# Patient Record
Sex: Male | Born: 1980
Health system: Western US, Academic
[De-identification: ages and names within clinical notes are randomized; demographics above are authoritative.]

## PROBLEM LIST (undated history)

## (undated) DIAGNOSIS — T7840XA Allergy, unspecified, initial encounter: Secondary | ICD-10-CM

## (undated) DIAGNOSIS — K219 Gastro-esophageal reflux disease without esophagitis: Secondary | ICD-10-CM

## (undated) DIAGNOSIS — IMO0002 Reserved for concepts with insufficient information to code with codable children: Secondary | ICD-10-CM

## (undated) HISTORY — DX: Reserved for concepts with insufficient information to code with codable children: IMO0002

## (undated) HISTORY — DX: Allergy, unspecified, initial encounter: T78.40XA

## (undated) HISTORY — DX: Gastro-esophageal reflux disease without esophagitis: K21.9

---

## 2005-03-04 ENCOUNTER — Emergency Department (HOSPITAL_COMMUNITY): Admission: EM | Admit: 2005-03-04 | Discharge: 2005-03-04 | Payer: Self-pay | Admitting: Family Medicine

## 2005-03-15 ENCOUNTER — Emergency Department (HOSPITAL_COMMUNITY): Admission: EM | Admit: 2005-03-15 | Discharge: 2005-03-15 | Payer: Self-pay | Admitting: Family Medicine

## 2006-07-09 ENCOUNTER — Emergency Department (HOSPITAL_COMMUNITY): Admission: EM | Admit: 2006-07-09 | Discharge: 2006-07-09 | Payer: Self-pay | Admitting: Emergency Medicine

## 2006-07-10 ENCOUNTER — Emergency Department (HOSPITAL_COMMUNITY): Admission: EM | Admit: 2006-07-10 | Discharge: 2006-07-10 | Payer: Self-pay | Admitting: Emergency Medicine

## 2006-07-11 ENCOUNTER — Emergency Department (HOSPITAL_COMMUNITY): Admission: EM | Admit: 2006-07-11 | Discharge: 2006-07-11 | Payer: Self-pay | Admitting: Emergency Medicine

## 2006-07-15 ENCOUNTER — Emergency Department (HOSPITAL_COMMUNITY): Admission: EM | Admit: 2006-07-15 | Discharge: 2006-07-15 | Payer: Self-pay | Admitting: Emergency Medicine

## 2007-04-23 ENCOUNTER — Emergency Department (HOSPITAL_COMMUNITY): Admission: EM | Admit: 2007-04-23 | Discharge: 2007-04-23 | Payer: Self-pay | Admitting: Family Medicine

## 2010-03-03 ENCOUNTER — Encounter
Admission: RE | Admit: 2010-03-03 | Discharge: 2010-03-03 | Payer: Self-pay | Source: Home / Self Care | Attending: Family Medicine | Admitting: Family Medicine

## 2010-03-05 ENCOUNTER — Encounter: Payer: Self-pay | Admitting: Gastroenterology

## 2010-03-26 NOTE — Letter (Signed)
Summary: New Patient letter  [facility]  [address] [place], [place] [address]   Phone: [phone]  Fax: [phone]       03/05/2010 MRN: [id number]  Luis Mcdonald [address] [place], [place]  [address]  Dear Luis Mcdonald,  Welcome to the Gastroenterology Division at [facility].    You are scheduled to see Dr.  Jarold Motto on   03-26-2010 at 8:30am on the 3rd floor at [facility], [address]  We ask that you try to arrive at our office 15 minutes prior to your appointment time to allow for check-in.  We would like you to complete the enclosed self-administered evaluation form prior to your visit and bring it with you on the day of your appointment.  We will review it with you.  Also, please bring a complete list of all your medications or, if you prefer, bring the medication bottles and we will list them.  Please bring your insurance card so that we may make a copy of it.  If your insurance requires a referral to see a specialist, please bring your referral form from your primary care physician.  Co-payments are due at the time of your visit and may be paid by cash, check or credit card.     Your office visit will consist of a consult with your physician (includes a physical exam), any laboratory testing he/she may order, scheduling of any necessary diagnostic testing (e.g. x-ray, ultrasound, CT-scan), and scheduling of a procedure (e.g. Endoscopy, Colonoscopy) if required.  Please allow enough time on your schedule to allow for any/all of these possibilities.    If you cannot keep your appointment, please call [phone] to cancel or reschedule prior to your appointment date.  This allows [place] the opportunity to schedule an appointment for another patient in need of care.  If you do not cancel or reschedule by 5 p.m. the business day prior to your appointment date, you will be charged a $50.00 late cancellation/no-show fee.    Thank you for choosing  [facility] for your medical needs.  We appreciate the opportunity to care for you.  Please visit [place] at our website  to learn more about our practice.                     Sincerely,                                                             The Gastroenterology Division

## 2011-02-23 HISTORY — PX: UPPER GI ENDOSCOPY: SHX6162

## 2011-10-11 ENCOUNTER — Other Ambulatory Visit: Payer: Self-pay | Admitting: Obstetrics and Gynecology

## 2011-10-11 ENCOUNTER — Other Ambulatory Visit: Payer: Self-pay

## 2011-10-11 DIAGNOSIS — R1011 Right upper quadrant pain: Secondary | ICD-10-CM

## 2011-10-12 ENCOUNTER — Ambulatory Visit
Admission: RE | Admit: 2011-10-12 | Discharge: 2011-10-12 | Disposition: A | Discharge status: Home / Self Care | Payer: 59 | Source: Ambulatory Visit | Attending: Obstetrics and Gynecology | Admitting: Obstetrics and Gynecology

## 2011-10-12 ENCOUNTER — Other Ambulatory Visit: Payer: Self-pay

## 2011-10-12 DIAGNOSIS — R1011 Right upper quadrant pain: Secondary | ICD-10-CM

## 2011-10-12 MED ORDER — IOHEXOL 300 MG/ML  SOLN
125.0000 mL | Freq: Once | INTRAMUSCULAR | Status: AC | PRN
  Administered 2011-10-12: 125 mL via INTRAVENOUS

## 2012-05-08 ENCOUNTER — Ambulatory Visit (INDEPENDENT_AMBULATORY_CARE_PROVIDER_SITE_OTHER): Payer: 59 | Admitting: Emergency Medicine

## 2012-05-08 ENCOUNTER — Ambulatory Visit: Payer: 59

## 2012-05-08 VITALS — BP 124/86 | HR 61 | Temp 98.0°F | Resp 16 | Ht 71.0 in | Wt 265.0 lb

## 2012-05-08 DIAGNOSIS — R21 Rash and other nonspecific skin eruption: Secondary | ICD-10-CM

## 2012-05-08 DIAGNOSIS — M549 Dorsalgia, unspecified: Secondary | ICD-10-CM

## 2012-05-08 LAB — LIPASE: Lipase: <10 U/L (ref 0–75)

## 2012-05-08 LAB — TSH: TSH: 1.061 u[IU]/mL (ref 0.350–4.500)

## 2012-05-08 LAB — COMPREHENSIVE METABOLIC PANEL
ALT: 25 U/L (ref 0–53)
Alkaline Phosphatase: 63 U/L (ref 39–117)
CO2: 30 mEq/L (ref 19–32)
Creat: 0.72 mg/dL (ref 0.50–1.35)
Glucose, Bld: 88 mg/dL (ref 70–99)
Sodium: 141 mEq/L (ref 135–145)
Total Bilirubin: 1.4 mg/dL — ABNORMAL HIGH (ref 0.3–1.2)
Total Protein: 6.6 g/dL (ref 6.0–8.3)

## 2012-05-08 LAB — POCT CBC
Lymph, poc: 2.3 (ref 0.6–3.4)
MCH, POC: 30.9 pg (ref 27–31.2)
MCHC: 34.6 g/dL (ref 31.8–35.4)
MCV: 89.4 fL (ref 80–97)
MID (cbc): 0.5 (ref 0–0.9)
MPV: 10.2 fL (ref 0–99.8)
POC LYMPH PERCENT: 40.6 %L (ref 10–50)
POC MID %: 8.4 %M (ref 0–12)
Platelet Count, POC: 267 10*3/uL (ref 142–424)
RDW, POC: 13.4 %
WBC: 5.6 10*3/uL (ref 4.6–10.2)

## 2012-05-08 LAB — POCT URINALYSIS DIPSTICK
Glucose, UA: NEGATIVE
Nitrite, UA: NEGATIVE
Protein, UA: NEGATIVE
Spec Grav, UA: >=1.03
Urobilinogen, UA: 0.2

## 2012-05-08 LAB — POCT UA - MICROSCOPIC ONLY
Casts, Ur, LPF, POC: NEGATIVE
Crystals, Ur, HPF, POC: NEGATIVE
Yeast, UA: NEGATIVE

## 2012-05-08 LAB — POCT SKIN KOH: Skin KOH, POC: NEGATIVE

## 2012-05-08 MED ORDER — HYDROCODONE-ACETAMINOPHEN 5-325 MG PO TABS: 1.0000 | ORAL_TABLET | Freq: Four times a day (QID) | ORAL | Status: DC | PRN

## 2012-05-08 MED ORDER — CYCLOBENZAPRINE HCL 10 MG PO TABS: ORAL_TABLET | ORAL | Status: DC

## 2012-05-08 MED ORDER — TRIAMCINOLONE 0.1 % CREAM:EUCERIN CREAM 1:1: 1.0000 "application " | TOPICAL_CREAM | Freq: Three times a day (TID) | CUTANEOUS | Status: DC

## 2012-05-08 MED ORDER — MELOXICAM 15 MG PO TABS: 7.5000 mg | ORAL_TABLET | Freq: Every day | ORAL | Status: DC

## 2012-05-08 NOTE — Progress Notes (Signed)
Subjective:    Patient ID: Luis Mcdonald, male    DOB: 1980-05-22, 32 y.o.   MRN: [id number]  HPI Upper back pain and right upper quadrant abdominal pain started last night (8:00).  Sharp pain underneath the shoulder blades that radiates to his stomach when he lays down. Currently a 4/6 on the pain scale.  Currently feels nauseous and complains a belching.  Occasionally feels symptoms similar to this when he eats raw vegetables. Claims to have gained significant weight recently (20 lbs. in 2 months).  irregular bowl movements.  Normally has a loose stool bowl movement every 2 days.  Has pain on a deep inspiration. Feels tired and fatigued all time throughout the day.  Has had problems with GERD in the past (for about 2 years).    Review of Systems patient also about it with a dry scaly rash involving both hands. This time ears hands and peeling cracked.     Objective:   Physical Exam physical exam reveals tenderness along the medial scapular border on the left. Breast sounds are symmetrical. Cardiac exam shows a regular rate without murmurs or gallops. The abdomen is flat liver and spleen not enlarged no masses felt no tenderness . Results for orders placed in visit on 05/08/12  POCT CBC      Result Value Range   WBC 5.6  4.6 - 10.2 K/uL   Lymph, poc 2.3  0.6 - 3.4   POC LYMPH PERCENT 40.6  10 - 50 %L   MID (cbc) 0.5  0 - 0.9   POC MID % 8.4  0 - 12 %M   POC Granulocyte 2.9  2 - 6.9   Granulocyte percent 51.0  37 - 80 %G   RBC 5.37  4.69 - 6.13 M/uL   Hemoglobin 16.6  14.1 - 18.1 g/dL   HCT, POC 82.9  56.2 - 53.7 %   MCV 89.4  80 - 97 fL   MCH, POC 30.9  27 - 31.2 pg   MCHC 34.6  31.8 - 35.4 g/dL   RDW, POC 13.0     Platelet Count, POC 267  142 - 424 K/uL   MPV 10.2  0 - 99.8 fL  POCT UA - MICROSCOPIC ONLY      Result Value Range   WBC, Ur, HPF, POC neg     RBC, urine, microscopic neg     Bacteria, U Microscopic neg     Mucus, UA neg     Epithelial cells, urine per micros rare      Crystals, Ur, HPF, POC neg     Casts, Ur, LPF, POC neg     Yeast, UA neg    POCT URINALYSIS DIPSTICK      Result Value Range   Color, UA yellow     Clarity, UA clear     Glucose, UA neg     Bilirubin, UA neg     Ketones, UA neg     Spec Grav, UA >=1.030     Blood, UA neg     pH, UA 5.5     Protein, UA neg     Urobilinogen, UA 0.2     Nitrite, UA neg     Leukocytes, UA Negative     [facility] reading (PRIMARY) by  Dr. Cleta Alberts there are no acute abnormalities. There is no evidence of obstruction. There are no masses seen  Results for orders placed in visit on 05/08/12  POCT CBC  Result Value Range   WBC 5.6  4.6 - 10.2 K/uL   Lymph, poc 2.3  0.6 - 3.4   POC LYMPH PERCENT 40.6  10 - 50 %L   MID (cbc) 0.5  0 - 0.9   POC MID % 8.4  0 - 12 %M   POC Granulocyte 2.9  2 - 6.9   Granulocyte percent 51.0  37 - 80 %G   RBC 5.37  4.69 - 6.13 M/uL   Hemoglobin 16.6  14.1 - 18.1 g/dL   HCT, POC 96.0  45.4 - 53.7 %   MCV 89.4  80 - 97 fL   MCH, POC 30.9  27 - 31.2 pg   MCHC 34.6  31.8 - 35.4 g/dL   RDW, POC 09.8     Platelet Count, POC 267  142 - 424 K/uL   MPV 10.2  0 - 99.8 fL  POCT UA - MICROSCOPIC ONLY      Result Value Range   WBC, Ur, HPF, POC neg     RBC, urine, microscopic neg     Bacteria, U Microscopic neg     Mucus, UA neg     Epithelial cells, urine per micros rare     Crystals, Ur, HPF, POC neg     Casts, Ur, LPF, POC neg     Yeast, UA neg    POCT URINALYSIS DIPSTICK      Result Value Range   Color, UA yellow     Clarity, UA clear     Glucose, UA neg     Bilirubin, UA neg     Ketones, UA neg     Spec Grav, UA >=1.030     Blood, UA neg     pH, UA 5.5     Protein, UA neg     Urobilinogen, UA 0.2     Nitrite, UA neg     Leukocytes, UA Negative          Assessment & Plan:  Labs were done and pending. Will treat his musculoskeletal pain . We'll treat with Flexeril and Mobic and triamcinolone Eucerin mix for his hands

## 2012-05-09 ENCOUNTER — Encounter: Payer: Self-pay | Admitting: Family Medicine

## 2013-06-08 IMAGING — CT CT ABD-PELV W/ CM
1 of 4 series · 14 of 36 positions shown, 18 images · IV contrast (READICAT/WATER & [ID] OMNI 300)
Comparison: Abdominal ultrasound 03/03/2010

CLINICAL DATA: Abdominal pain, nausea (pain in right upper
quadrant, mid abdomen, right lower quadrant and flank)

CT ABDOMEN AND PELVIS WITH CONTRAST
TECHNIQUE: Multidetector CT imaging of the abdomen and pelvis was
performed following the standard protocol during bolus
administration of intravenous contrast.
Contrast: 125mL OMNIPAQUE IOHEXOL 300 MG/ML  SOLN

[Series 2: abd/pelvis with · axial · 0.86mm/px · z∈[-477,-22]mm · 14 of 101 slices shown, 18 images]
[im 5/101  soft-tissue]
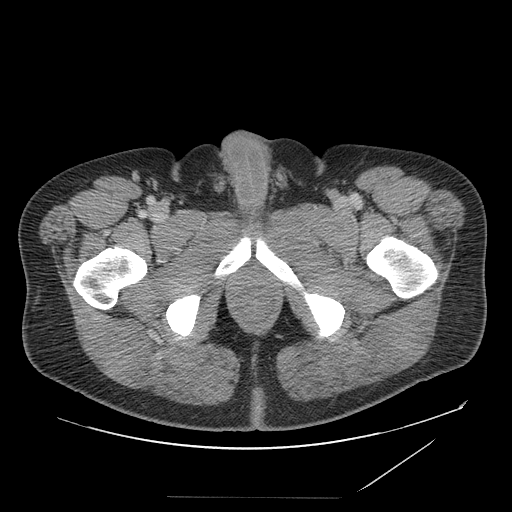
[im 5/101  bone]
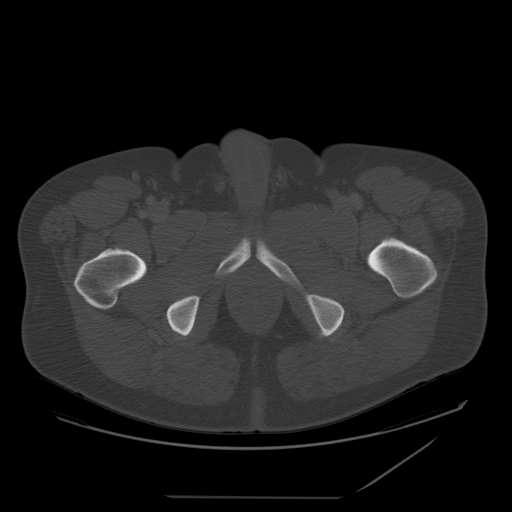
[im 14/101  soft-tissue]
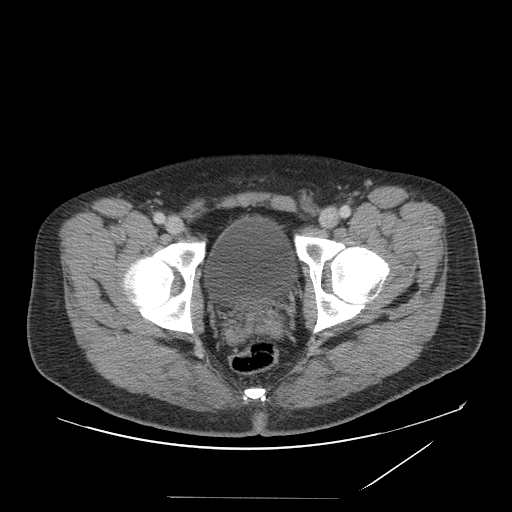
[im 22/101  soft-tissue]
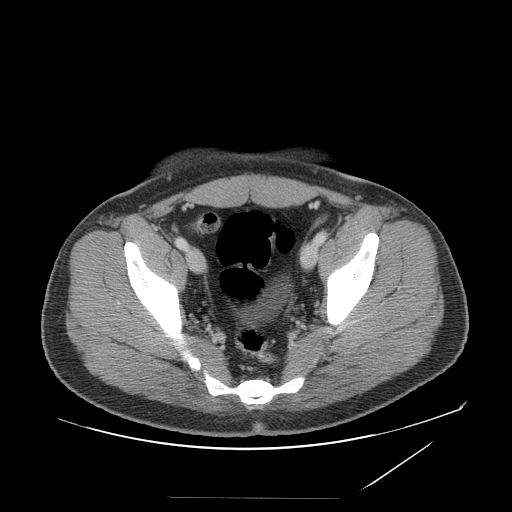
[im 31/101  soft-tissue]
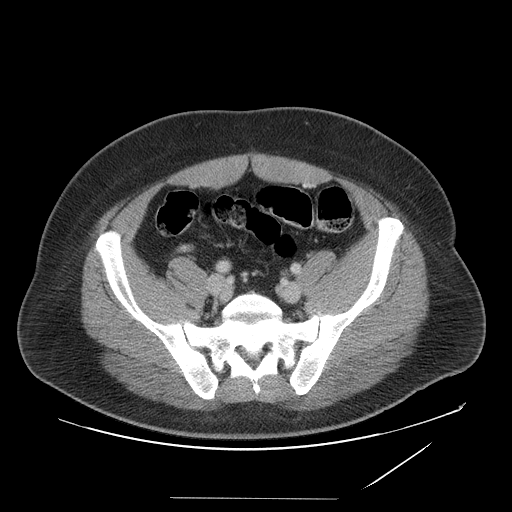
[im 40/101  soft-tissue]
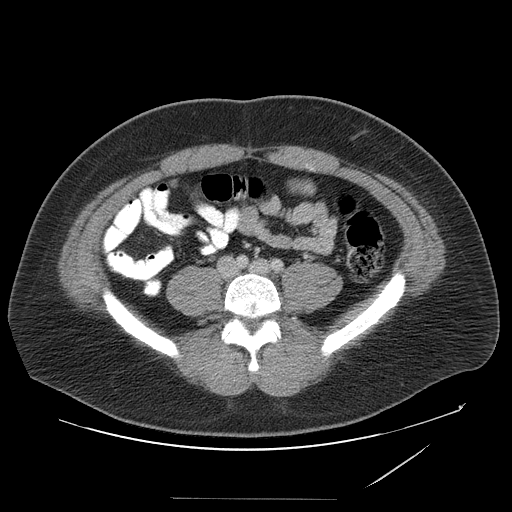
[im 48/101  soft-tissue]
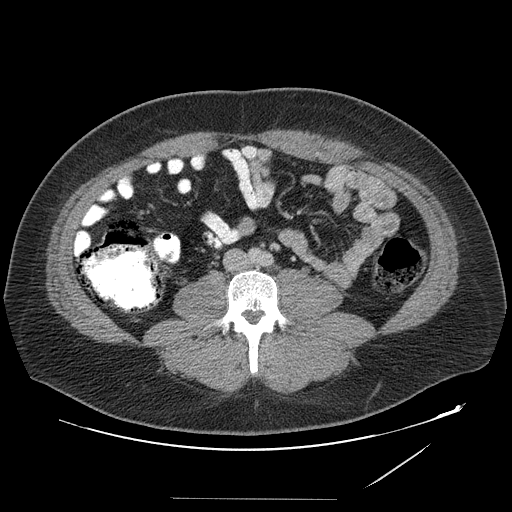
[im 53/101  soft-tissue]
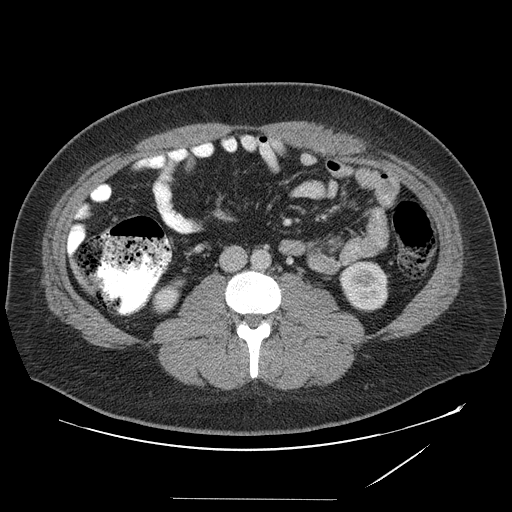
[im 61/101  soft-tissue]
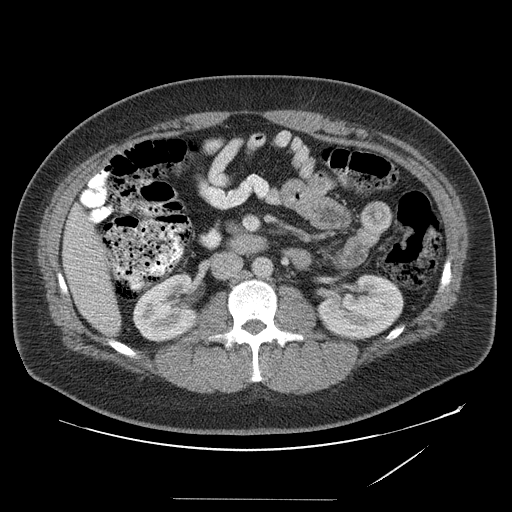
[im 70/101  soft-tissue]
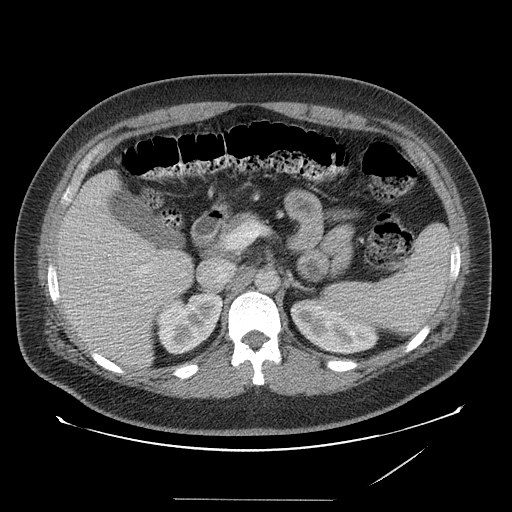
[im 70/101  bone]
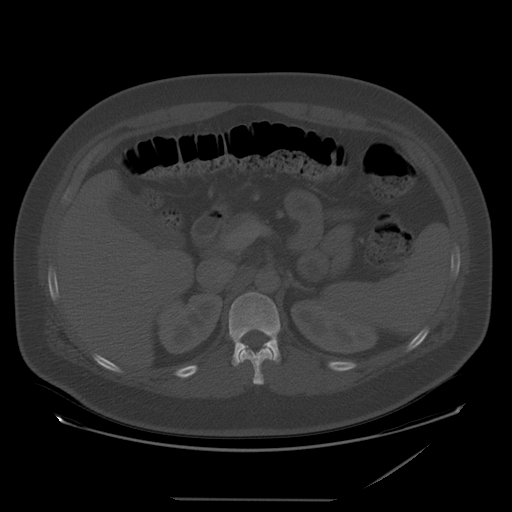
[im 79/101  soft-tissue]
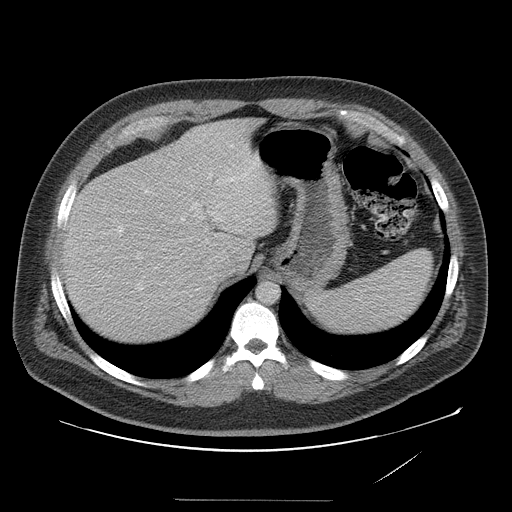
[im 83/101  lung]
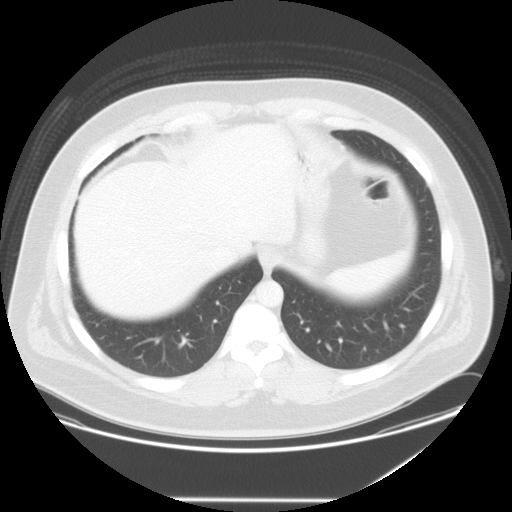
[im 87/101  soft-tissue]
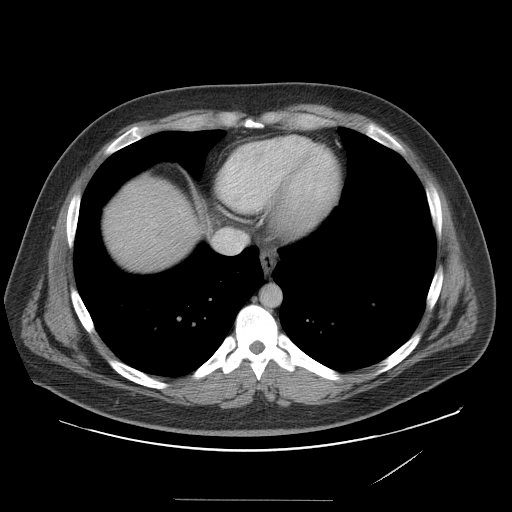
[im 87/101  lung]
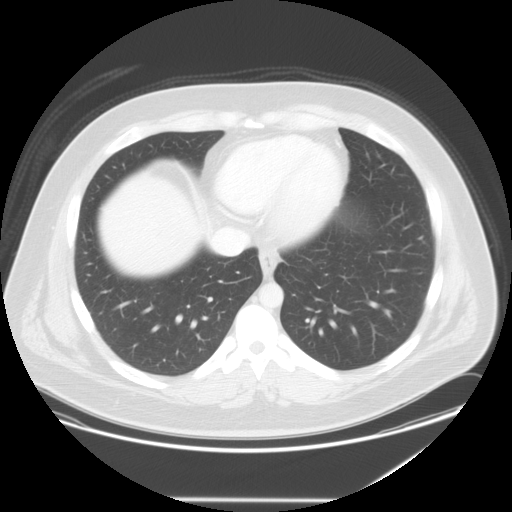
[im 92/101  lung]
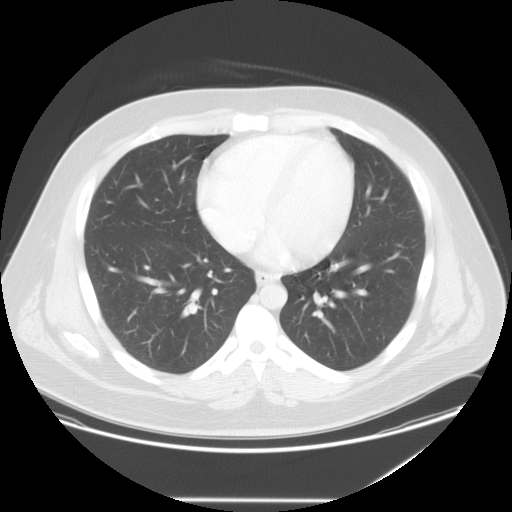
[im 96/101  soft-tissue]
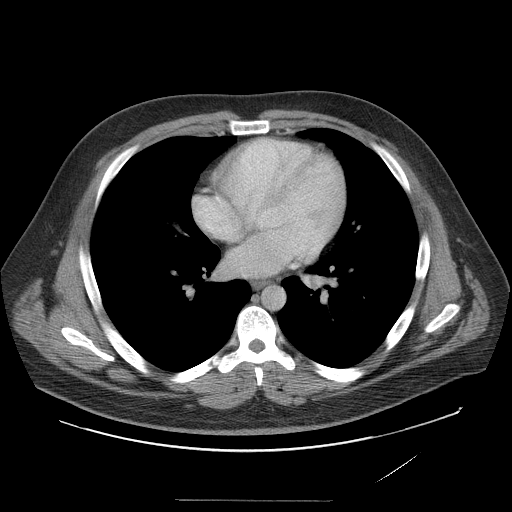
[im 96/101  lung]
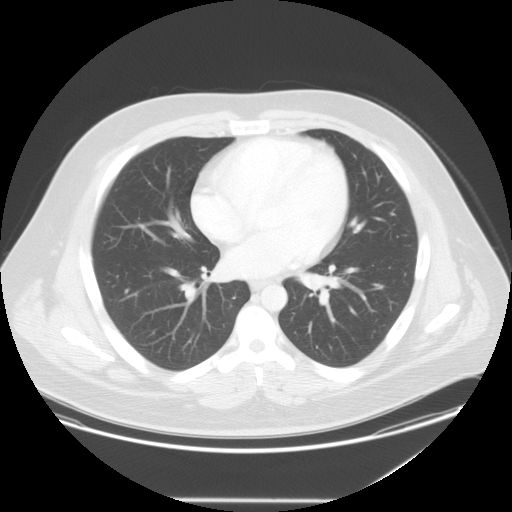

[14 of 36 positions shown; findings below may reference images not displayed]

FINDINGS: Lower Chest:  The visualized lower thorax is unremarkable.
Specifically, no pulmonary nodules identified in the lung bases.
The visualized cardiac structures are unremarkable.  No pericardial
effusion.  Distal thoracic esophagus is within normal limits.

Abdomen: The stomach, and duodenum are unremarkable.  Normal CT
appearance of the spleen, adrenal glands and liver.  There is a sub
centimeter (6 mm) hypoattenuating focus in the right hepatic lobe
(segment [DATE]) which is too small to characterize but statistically
highly likely a benign cyst or biliary hamartoma. Gallbladder is
unremarkable. No intra or extrahepatic biliary ductal dilatation.

Subtle haziness of the intervening fat between the pancreatic head
and adjacent descending duodenum.

Normal radiographic appearance of the kidneys.  No hydronephrosis,
no focal lesion

Normal-caliber large and small bowel throughout the abdomen.  No
evidence of bowel obstruction.  No significant colonic diverticular
disease.  Normal appendix in the right lower quadrant.  No free
fluid, or suspicious adenopathy.

Pelvis: Normal bladder.  Normal male reproductive organs.  No free
fluid or suspicious adenopathy.

Bones: No acute fracture or aggressive appearing lytic or blastic
osseous lesion.

Vascular: No significant atherosclerotic vascular disease
IMPRESSION: 1.  Subtle haziness within the retroperitoneal fat between the
descending duodenum and pancreatic head.  This is a nonspecific
imaging finding that can be seen in groove pancreatitis.  Recommend
clinical correlation with serum lipase.

2.  Otherwise, no acute intra-abdominal or pelvic findings to
explain the patient's clinical symptoms.

## 2014-01-03 IMAGING — CR DG ABDOMEN ACUTE W/ 1V CHEST
3 series · 3 of 3 positions shown · non-contrast
Comparison: CT scan 10/12/2011

CLINICAL DATA: Low back pain

ACUTE ABDOMEN SERIES (ABDOMEN 2 VIEW & CHEST 1 VIEW)

[PA]
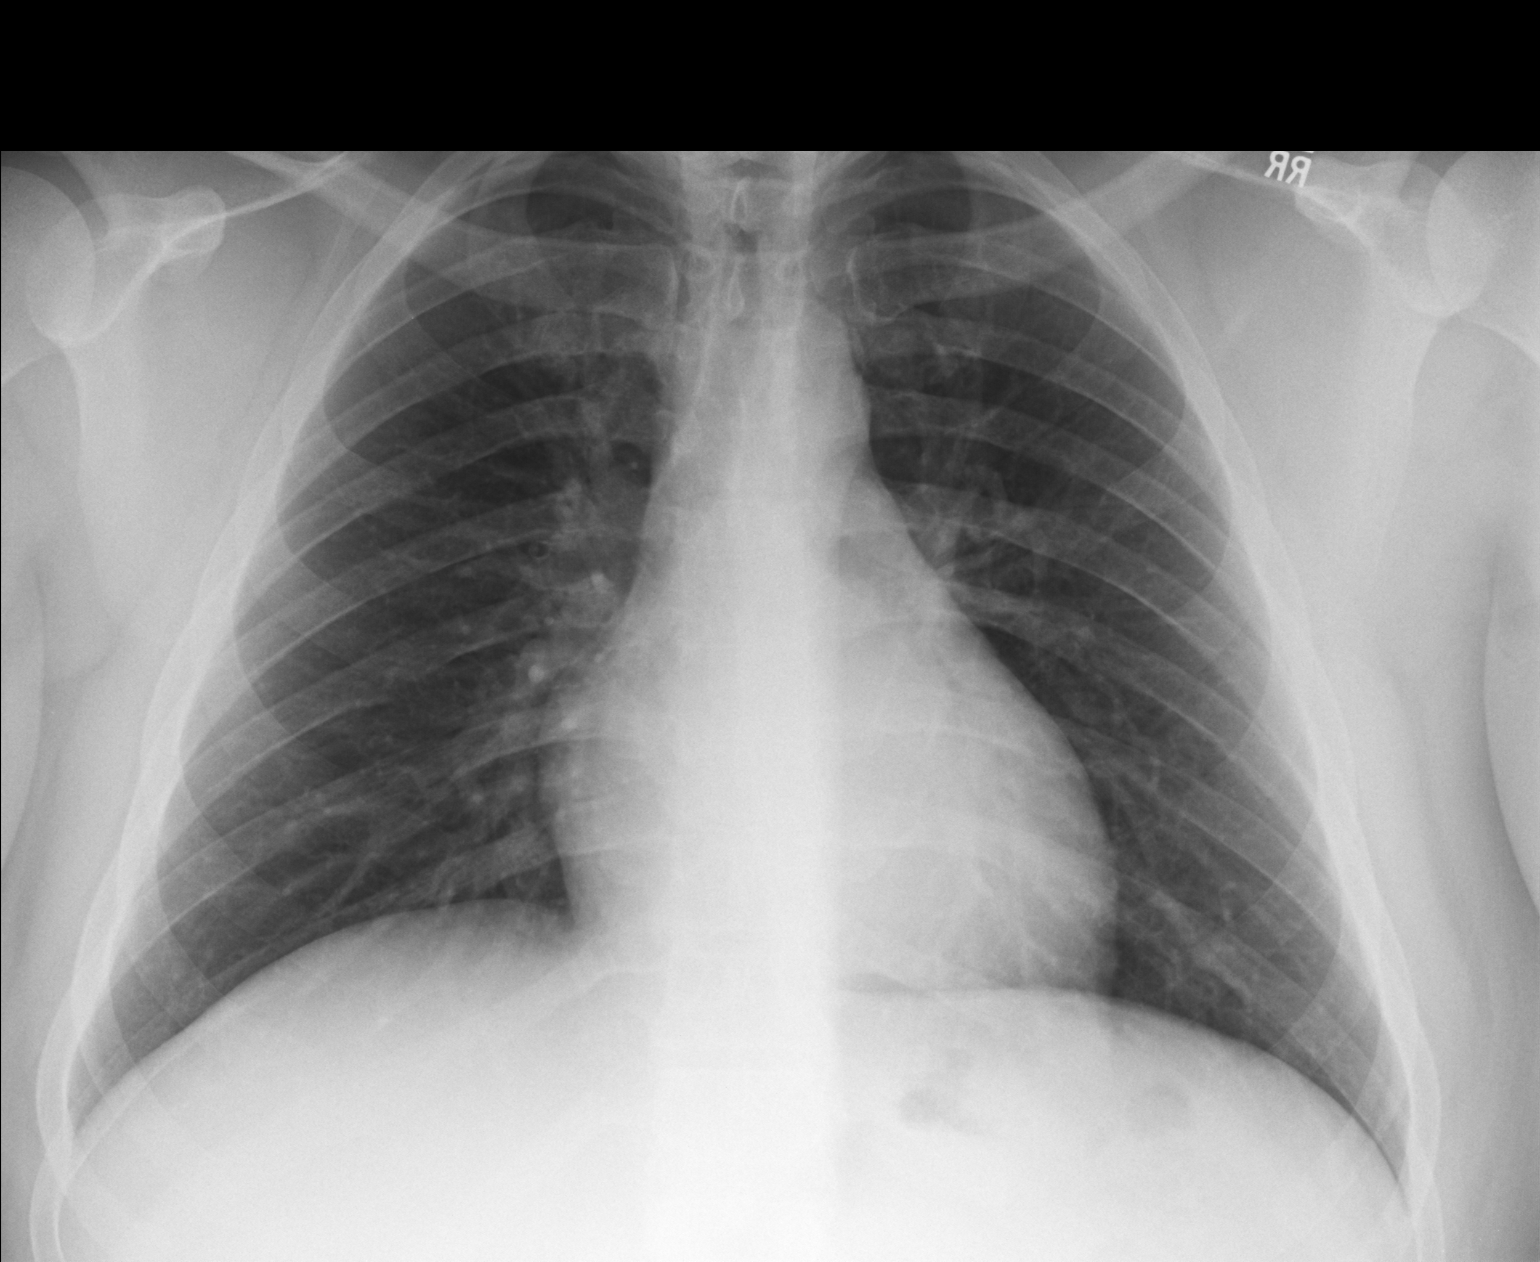

[AP (1 of 2)]
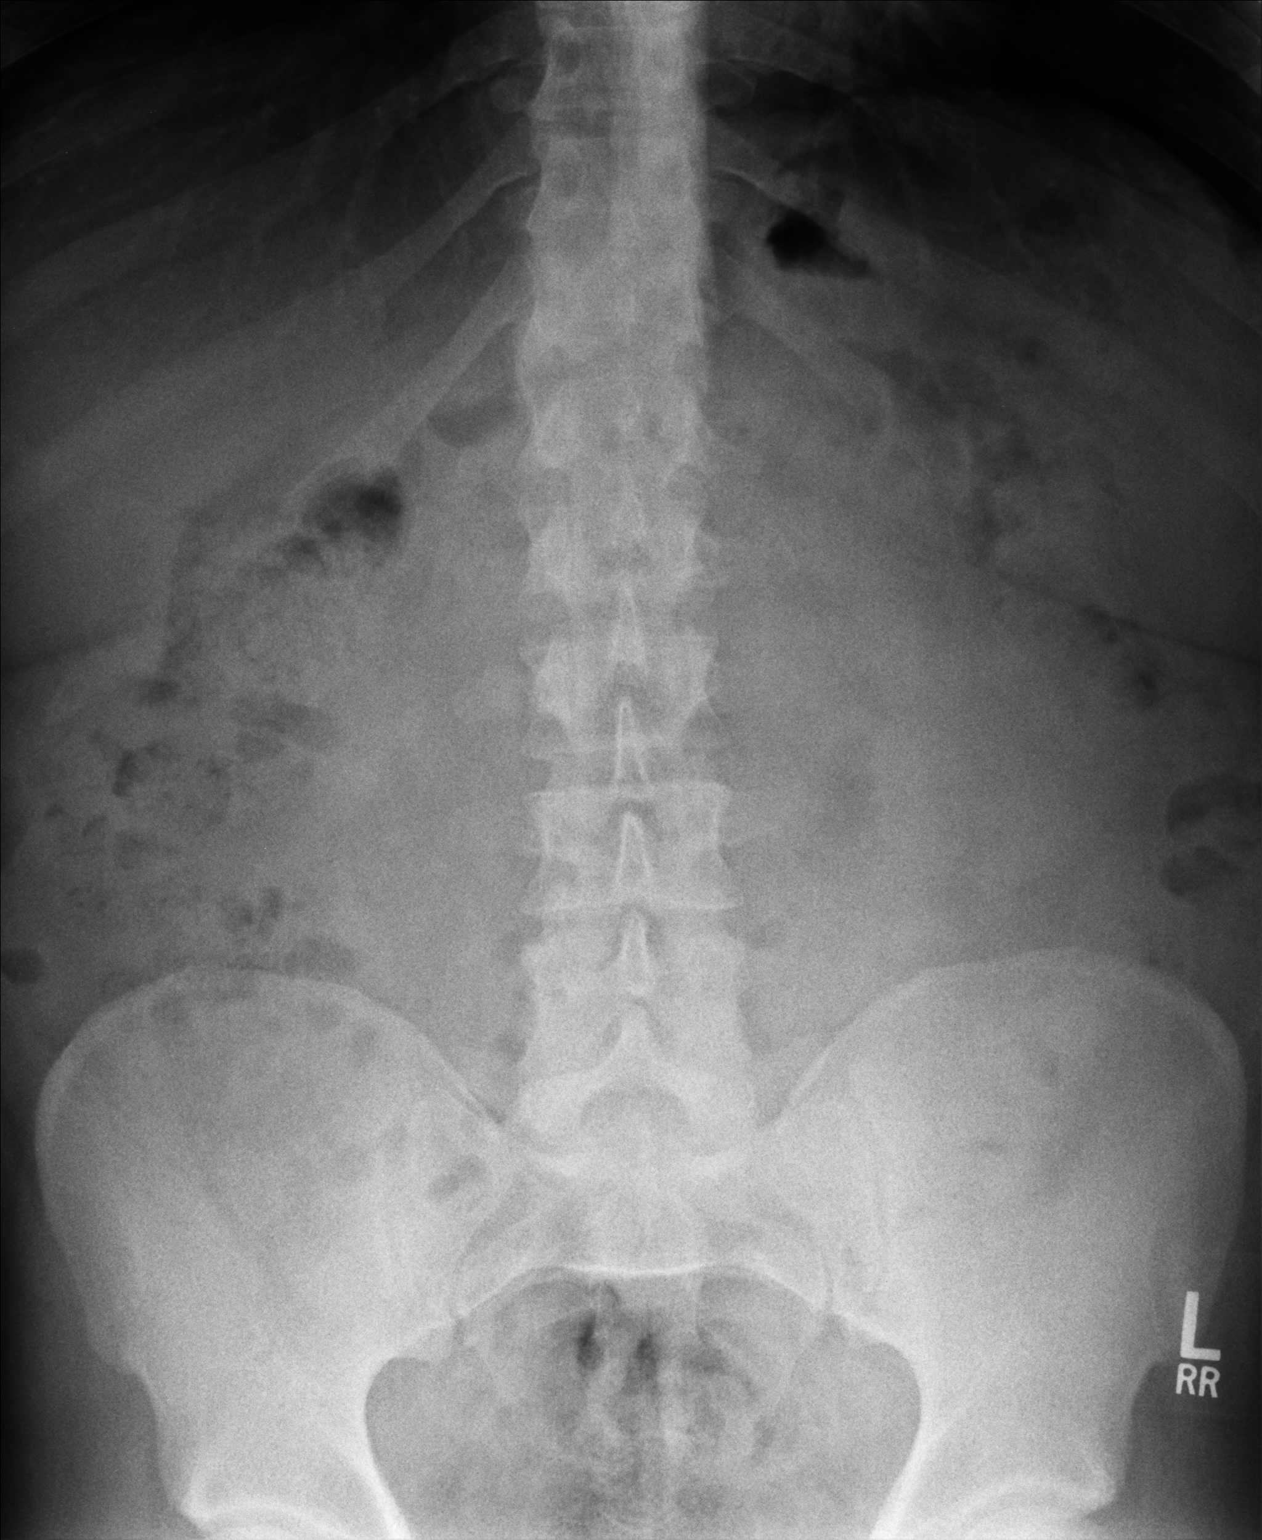

[AP (2 of 2)]
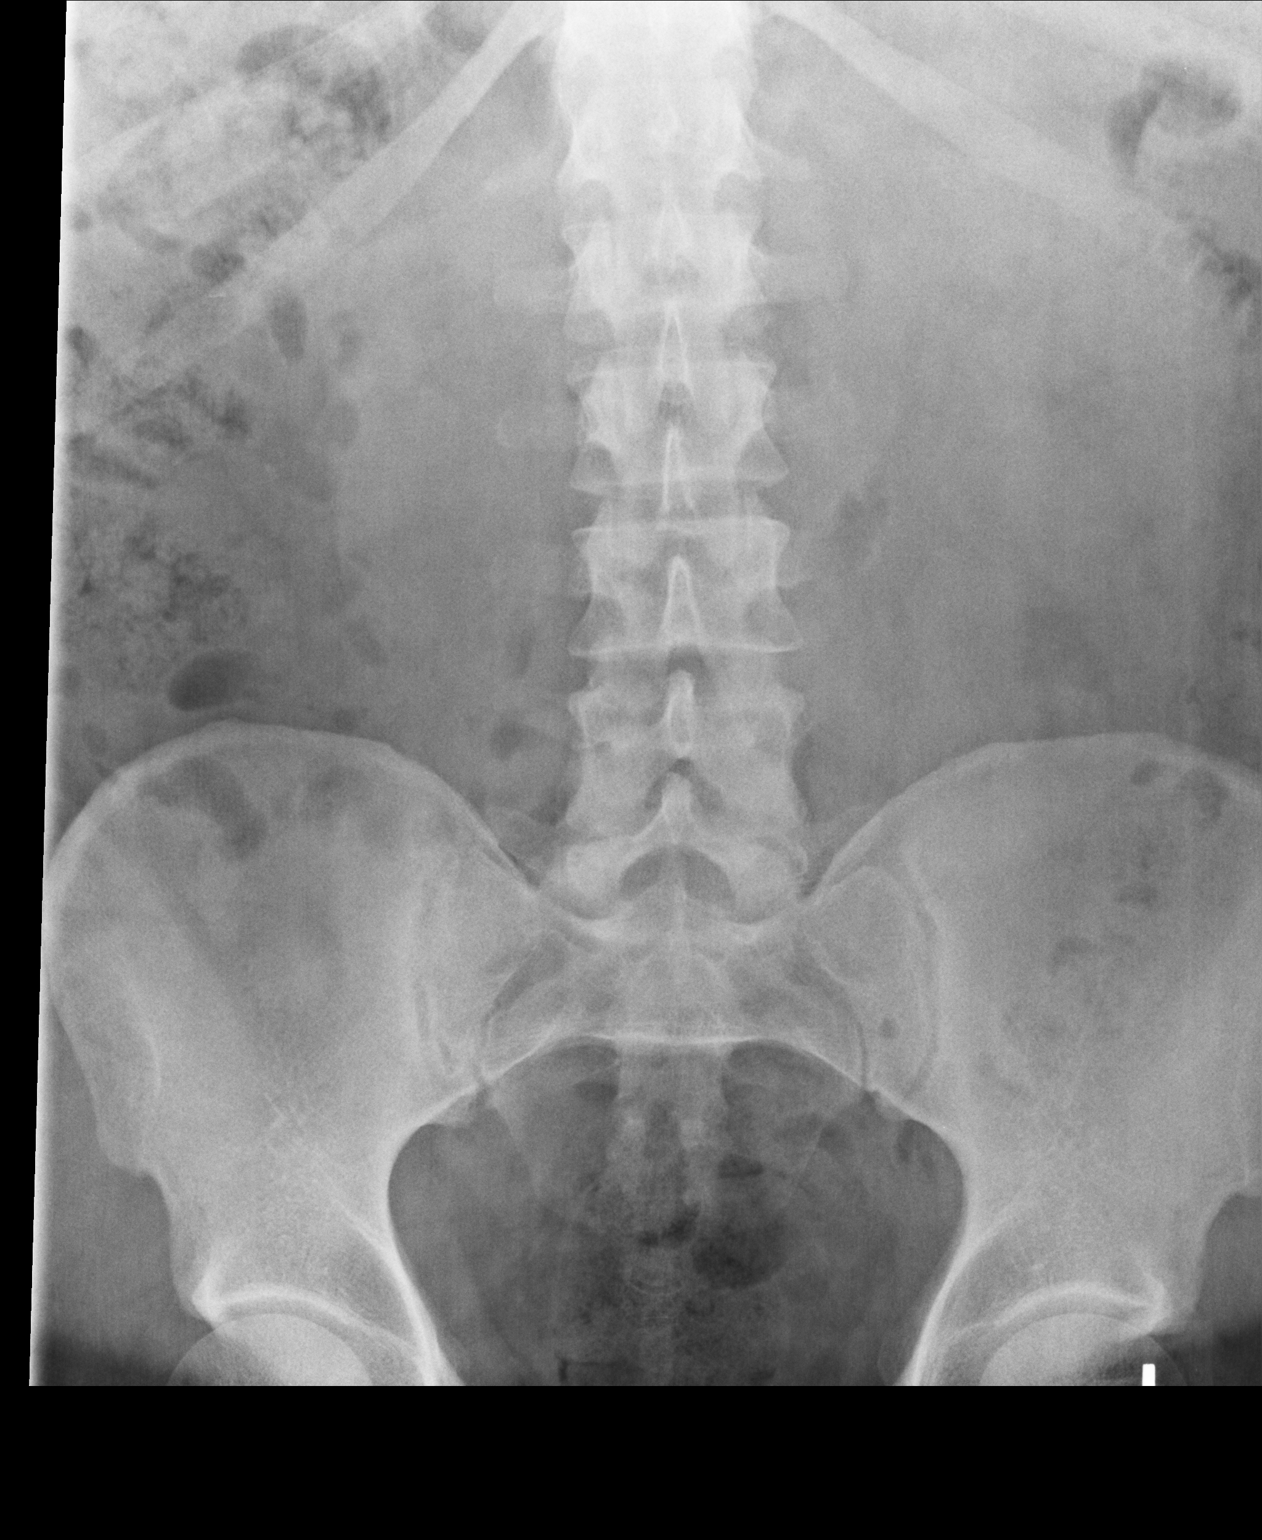

[3 of 3 positions shown; findings below may reference images not displayed]

FINDINGS: Cardiomediastinal silhouette is unremarkable.  No acute
infiltrate or pleural effusion.  No pulmonary edema.  There is
nonspecific nonobstructive bowel gas pattern.  Stool noted in the
right and left colon.  No free abdominal air.
IMPRESSION: No acute disease.  Nonspecific nonobstructive bowel gas pattern.

## 2015-01-28 ENCOUNTER — Emergency Department
Admission: EM | Admit: 2015-01-28 | Discharge: 2015-01-28 | Disposition: A | Discharge status: Home / Self Care | Payer: BLUE CROSS/BLUE SHIELD | Attending: Emergency Medicine | Admitting: Emergency Medicine

## 2015-01-28 DIAGNOSIS — R51 Headache: Secondary | ICD-10-CM | POA: Insufficient documentation

## 2015-01-28 DIAGNOSIS — Z7952 Long term (current) use of systemic steroids: Secondary | ICD-10-CM | POA: Insufficient documentation

## 2015-01-28 DIAGNOSIS — R1013 Epigastric pain: Secondary | ICD-10-CM | POA: Insufficient documentation

## 2015-01-28 DIAGNOSIS — R112 Nausea with vomiting, unspecified: Secondary | ICD-10-CM | POA: Diagnosis present

## 2015-01-28 DIAGNOSIS — Z79899 Other long term (current) drug therapy: Secondary | ICD-10-CM | POA: Insufficient documentation

## 2015-01-28 DIAGNOSIS — R197 Diarrhea, unspecified: Secondary | ICD-10-CM | POA: Insufficient documentation

## 2015-01-28 LAB — COMPREHENSIVE METABOLIC PANEL
ALK PHOS: 54 U/L (ref 38–126)
ALT: 23 U/L (ref 17–63)
ANION GAP: 7 (ref 5–15)
AST: 17 U/L (ref 15–41)
Albumin: 4 g/dL (ref 3.5–5.0)
BILIRUBIN TOTAL: 2.4 mg/dL — AB (ref 0.3–1.2)
BUN: 14 mg/dL (ref 6–20)
CALCIUM: 8.9 mg/dL (ref 8.9–10.3)
CO2: 26 mmol/L (ref 22–32)
Chloride: 103 mmol/L (ref 101–111)
Creatinine, Ser: 0.92 mg/dL (ref 0.61–1.24)
GFR calc non Af Amer: >60 mL/min (ref >60)
GLUCOSE: 101 mg/dL — AB (ref 65–99)
Potassium: 3.6 mmol/L (ref 3.5–5.1)
Sodium: 136 mmol/L (ref 135–145)
TOTAL PROTEIN: 7.4 g/dL (ref 6.5–8.1)

## 2015-01-28 LAB — LIPASE, BLOOD: Lipase: 19 U/L (ref 11–51)

## 2015-01-28 LAB — URINALYSIS COMPLETE WITH MICROSCOPIC (ARMC ONLY)
BILIRUBIN URINE: NEGATIVE
Bacteria, UA: NONE SEEN
Glucose, UA: NEGATIVE mg/dL
Hgb urine dipstick: NEGATIVE
KETONES UR: NEGATIVE mg/dL
Leukocytes, UA: NEGATIVE
NITRITE: NEGATIVE
PROTEIN: NEGATIVE mg/dL
RBC / HPF: NONE SEEN RBC/hpf (ref 0–5)
SPECIFIC GRAVITY, URINE: 1.015 (ref 1.005–1.030)
Squamous Epithelial / LPF: NONE SEEN
pH: 6 (ref 5.0–8.0)

## 2015-01-28 LAB — CBC
HCT: 47.9 % (ref 40.0–52.0)
HEMOGLOBIN: 16.6 g/dL (ref 13.0–18.0)
MCH: 29.4 pg (ref 26.0–34.0)
MCHC: 34.8 g/dL (ref 32.0–36.0)
MCV: 84.6 fL (ref 80.0–100.0)
Platelets: 218 10*3/uL (ref 150–440)
RBC: 5.66 MIL/uL (ref 4.40–5.90)
RDW: 12.6 % (ref 11.5–14.5)
WBC: 8.4 10*3/uL (ref 3.8–10.6)

## 2015-01-28 MED ORDER — ONDANSETRON 4 MG PO TBDP
8.0000 mg | ORAL_TABLET | Freq: Once | ORAL | Status: AC
  Administered 2015-01-28: 8 mg via ORAL
  Filled 2015-01-28: qty 2

## 2015-01-28 MED ORDER — ONDANSETRON HCL 4 MG PO TABS: 4.0000 mg | ORAL_TABLET | Freq: Three times a day (TID) | ORAL | Status: DC | PRN

## 2015-01-28 MED ORDER — GI COCKTAIL ~~LOC~~
30.0000 mL | Freq: Once | ORAL | Status: AC
  Administered 2015-01-28: 30 mL via ORAL
  Filled 2015-01-28: qty 30

## 2015-01-28 NOTE — ED Provider Notes (Addendum)
[facility] Emergency Department Provider Note   ____________________________________________  Time seen:  I have reviewed the triage vital signs and the triage nursing note.  HISTORY  Chief Complaint Emesis and Diarrhea   Historian Patient  HPI ASER Mcdonald is a 34 y.o. male who is here for evaluation of nausea, vomiting, diarrhea since yesterday evening after work. He's also had body aches and a mild generalized headache. No significant nasal congestion or cough. No known fever. Positive sick contacts kids. He is having some epigastric burning that feels like acid reflux. He has been able to keep fluids down, but not solid food. Bloody diarrhea or bloody emesis. He went to [facility] clinic walk-in and was told to come to the ER for further evaluation. No additional workup or provider was seen.Symptoms are moderate. No exacerbating or alleviating symptoms.    Past Medical History  Diagnosis Date  . Allergy   . GERD (gastroesophageal reflux disease)   . Ulcer     There are no active problems to display for this patient.   History reviewed. No pertinent past surgical history.  Current Outpatient Rx  Name  Route  Sig  Dispense  Refill  . cyclobenzaprine (FLEXERIL) 10 MG tablet      Take one half to one tablet 3 times a day.   30 tablet   0   . HYDROcodone-acetaminophen (NORCO) 5-325 MG per tablet   Oral   Take 1 tablet by mouth every 6 (six) hours as needed for pain.   30 tablet   0   . ondansetron (ZOFRAN) 4 MG tablet   Oral   Take 1 tablet (4 mg total) by mouth every 8 (eight) hours as needed for nausea or vomiting.   10 tablet   0   . Triamcinolone Acetonide (TRIAMCINOLONE 0.1 % CREAM : EUCERIN) CREA   Topical   Apply 1 application topically 3 (three) times daily.   1 each   0     Mixed 240 g of 0.1% triamcinolone with 240 g of Eu ...     Allergies Review of patient's allergies indicates no known allergies.  Family History   Problem Relation Age of Onset  . Thyroid disease Mother   . Cancer Father     skin  . Heart disease Maternal Grandmother   . Cancer Maternal Grandfather     lymphoma  . Diabetes Paternal Grandfather     Social History Social History  Substance Use Topics  . Smoking status: Never Smoker   . Smokeless tobacco: None  . Alcohol Use: Yes    Review of Systems  Constitutional: Negative for fever. Eyes: Negative for visual changes. ENT: Negative for sore throat. Cardiovascular: Negative for chest pain. Respiratory: Negative for shortness of breath. Gastrointestinal: As per history of present illness Genitourinary: Negative for dysuria. Musculoskeletal: Negative for back pain. Skin: Negative for rash. Neurological: Negative for confusion or altered mental status, weakness or numbness.. 10 point Review of Systems otherwise negative ____________________________________________   PHYSICAL EXAM:  VITAL SIGNS: ED Triage Vitals  Enc Vitals Group     BP 01/28/15 1730 128/82 mmHg     Pulse Rate 01/28/15 1730 95     Resp 01/28/15 1730 17     Temp 01/28/15 1730 98.5 F (36.9 C)     Temp Source 01/28/15 1730 Oral     SpO2 01/28/15 1730 95 %     Weight 01/28/15 1730 260 lb (117.935 kg)     Height 01/28/15  1730 5\' 11"  (1.803 m)     Head Cir --      Peak Flow --      Pain Score 01/28/15 1731 5     Pain Loc --      Pain Edu? --      Excl. in GC? --      Constitutional: Alert and oriented. Well appearing and in no distress. Eyes: Conjunctivae are normal. PERRL. Normal extraocular movements. ENT   Head: Normocephalic and atraumatic.   Nose: No congestion/rhinnorhea.   Mouth/Throat: Mucous membranes are mildly dry orally.   Neck: No stridor. Cardiovascular/Chest: Normal rate, regular rhythm.  No murmurs, rubs, or gallops. Respiratory: Normal respiratory effort without tachypnea nor retractions. Breath sounds are clear and equal bilaterally. No  wheezes/rales/rhonchi. Gastrointestinal: Soft. No distention, no guarding, no rebound. Mild epigastric discomfort. No right upper quadrant or lower abdominal tenderness.  Genitourinary/rectal:Deferred Musculoskeletal: Nontender with normal range of motion in all extremities. No joint effusions.  No lower extremity tenderness.  No edema. Neurologic:  Normal speech and language. No gross or focal neurologic deficits are appreciated. Skin:  Skin is warm, dry and intact. No rash noted. Psychiatric: Mood and affect are normal. Speech and behavior are normal. Patient exhibits appropriate insight and judgment.  ____________________________________________   EKG I, Governor Rooksebecca Jaunita Mikels, MD, the attending physician have personally viewed and interpreted all ECGs.  No EKG performed ____________________________________________  LABS (pertinent positives/negatives)  Lipase 19 Comprehensive metabolic panel within normal limits except glucose 101 and bili 2.4 White blood count 8.4, hemoglobin 16.6 and platelet count 218 Urinalysis negative for ketones, nitrites, lives eyes, red blood cells, white blood cells, or bacteria ____________________________________________  RADIOLOGY All Xrays were viewed by me. Imaging interpreted by Radiologist.  None __________________________________________  PROCEDURES  Procedure(s) performed: None  Critical Care performed: None  ____________________________________________   ED COURSE / ASSESSMENT AND PLAN  CONSULTATIONS: None  Pertinent labs & imaging results that were available during my care of the patient were reviewed by me and considered in my medical decision making (see chart for details).   Patient is overall well-appearing with stable vital signs and reassuring laboratory evaluation as well as clinical physical exam. Clinically I'm suspicious of viral syndrome/gastroenteritis. Patient has mild dry lips, but he is able to take by mouth fluids, I do  not think there is indication for IV fluids. I will discharge the patient with Zofran prescription. We discussed return precautions. He is having some epigastric burning consistent with prior episodes of GERD. He is getting one dose of GI cocktail in the emergency department.  In terms of his mild global headache, I suspect this is related to the viral syndrome. There are no high-risk/lymphatic features on history or physical exam to make me concerned about an emergency cause of headache.  Patient / Family / Caregiver informed of clinical course, medical decision-making process, and agree with plan.   I discussed return precautions, follow-up instructions, and discharged instructions with patient and/or family.  ___________________________________________   FINAL CLINICAL IMPRESSION(S) / ED DIAGNOSES   Final diagnoses:  Nausea vomiting and diarrhea       Governor Rooksebecca Ahnya Akre, MD 01/28/15 2011  Governor Rooksebecca Ermin Parisien, MD 01/28/15 2012

## 2015-01-28 NOTE — Discharge Instructions (Signed)
You were evaluated for nausea, vomiting, diarrhea, body aches and headache, and although no certain cause was found, your exam and evaluation are reassuring in the emergency department.  I suspect a viral illness. You may take over-the-counter Tylenol and I'm proven as needed for body aches and mild headache. You're being prescribed Zofran for nausea as needed.  Taking fluids frequently to help prevent dehydration. Return to the emergency department for any worsening condition including no worsening abdominal pain, weakness, dizziness, passing out, fever, trouble breathing or chest pain, worsening headache, confusion or altered mental status or any other symptoms concerning to you.    Diarrhea Diarrhea is watery poop (stool). It can make you feel weak, tired, thirsty, or give you a dry mouth (signs of dehydration). Watery poop is a sign of another problem, most often an infection. It often lasts 2-3 days. It can last longer if it is a sign of something serious. Take care of yourself as told by your doctor. HOME CARE   Drink 1 cup (8 ounces) of fluid each time you have watery poop.  Do not drink the following fluids:  Those that contain simple sugars (fructose, glucose, galactose, lactose, sucrose, maltose).  Sports drinks.  Fruit juices.  Whole milk products.  Sodas.  Drinks with caffeine (coffee, tea, soda) or alcohol.  Oral rehydration solution may be used if the doctor says it is okay. You may make your own solution. Follow this recipe:   - teaspoon table salt.   teaspoon baking soda.   teaspoon salt substitute containing potassium chloride.  1 tablespoons sugar.  1 liter (34 ounces) of water.  Avoid the following foods:  High fiber foods, such as raw fruits and vegetables.  Nuts, seeds, and whole grain breads and cereals.   Those that are sweetened with sugar alcohols (xylitol, sorbitol, mannitol).  Try eating the following foods:  Starchy foods, such as rice,  toast, pasta, low-sugar cereal, oatmeal, baked potatoes, crackers, and bagels.  Bananas.  Applesauce.  Eat probiotic-rich foods, such as yogurt and milk products that are fermented.  Wash your hands well after each time you have watery poop.  Only take medicine as told by your doctor.  Take a warm bath to help lessen burning or pain from having watery poop. GET HELP RIGHT AWAY IF:   You cannot drink fluids without throwing up (vomiting).  You keep throwing up.  You have blood in your poop, or your poop looks black and tarry.  You do not pee (urinate) in 6-8 hours, or there is only a small amount of very dark pee.  You have belly (abdominal) pain that gets worse or stays in the same spot (localizes).  You are weak, dizzy, confused, or light-headed.  You have a very bad headache.  Your watery poop gets worse or does not get better.  You have a fever or lasting symptoms for more than 2-3 days.  You have a fever and your symptoms suddenly get worse. MAKE SURE YOU:   Understand these instructions.  Will watch your condition.  Will get help right away if you are not doing well or get worse.   This information is not intended to replace advice given to you by your health care provider. Make sure you discuss any questions you have with your health care provider.   Document Released: 07/28/2007 Document Revised: 03/01/2014 Document Reviewed: 10/17/2011 Elsevier Interactive Patient Education 2016 Elsevier Inc.  Nausea and Vomiting Nausea is a sick feeling that often comes before  throwing up (vomiting). Vomiting is a reflex where stomach contents come out of your mouth. Vomiting can cause severe loss of body fluids (dehydration). Children and elderly adults can become dehydrated quickly, especially if they also have diarrhea. Nausea and vomiting are symptoms of a condition or disease. It is important to find the cause of your symptoms. CAUSES   Direct irritation of the  stomach lining. This irritation can result from increased acid production (gastroesophageal reflux disease), infection, food poisoning, taking certain medicines (such as nonsteroidal anti-inflammatory drugs), alcohol use, or tobacco use.  Signals from the brain.These signals could be caused by a headache, heat exposure, an inner ear disturbance, increased pressure in the brain from injury, infection, a tumor, or a concussion, pain, emotional stimulus, or metabolic problems.  An obstruction in the gastrointestinal tract (bowel obstruction).  Illnesses such as diabetes, hepatitis, gallbladder problems, appendicitis, kidney problems, cancer, sepsis, atypical symptoms of a heart attack, or eating disorders.  Medical treatments such as chemotherapy and radiation.  Receiving medicine that makes you sleep (general anesthetic) during surgery. DIAGNOSIS Your caregiver may ask for tests to be done if the problems do not improve after a few days. Tests may also be done if symptoms are severe or if the reason for the nausea and vomiting is not clear. Tests may include:  Urine tests.  Blood tests.  Stool tests.  Cultures (to look for evidence of infection).  X-rays or other imaging studies. Test results can help your caregiver make decisions about treatment or the need for additional tests. TREATMENT You need to stay well hydrated. Drink frequently but in small amounts.You may wish to drink water, sports drinks, clear broth, or eat frozen ice pops or gelatin dessert to help stay hydrated.When you eat, eating slowly may help prevent nausea.There are also some antinausea medicines that may help prevent nausea. HOME CARE INSTRUCTIONS   Take all medicine as directed by your caregiver.  If you do not have an appetite, do not force yourself to eat. However, you must continue to drink fluids.  If you have an appetite, eat a normal diet unless your caregiver tells you differently.  Eat a variety of  complex carbohydrates (rice, wheat, potatoes, bread), lean meats, yogurt, fruits, and vegetables.  Avoid high-fat foods because they are more difficult to digest.  Drink enough water and fluids to keep your urine clear or pale yellow.  If you are dehydrated, ask your caregiver for specific rehydration instructions. Signs of dehydration may include:  Severe thirst.  Dry lips and mouth.  Dizziness.  Dark urine.  Decreasing urine frequency and amount.  Confusion.  Rapid breathing or pulse. SEEK IMMEDIATE MEDICAL CARE IF:   You have blood or brown flecks (like coffee grounds) in your vomit.  You have black or bloody stools.  You have a severe headache or stiff neck.  You are confused.  You have severe abdominal pain.  You have chest pain or trouble breathing.  You do not urinate at least once every 8 hours.  You develop cold or clammy skin.  You continue to vomit for longer than 24 to 48 hours.  You have a fever. MAKE SURE YOU:   Understand these instructions.  Will watch your condition.  Will get help right away if you are not doing well or get worse.   This information is not intended to replace advice given to you by your health care provider. Make sure you discuss any questions you have with your health care provider.  Document Released: 02/08/2005 Document Revised: 05/03/2011 Document Reviewed: 07/08/2010 Elsevier Interactive Patient Education [employer] Elsevier Inc.

## 2015-01-28 NOTE — ED Notes (Signed)
Pt c/o N/V/D since yesterday.. Both of his children have been sick with same sx..Marland Kitchen

## 2015-01-28 NOTE — ED Notes (Signed)
Pt states nausea, vomiting, diaherra, and body aches since yesterday, states headache and no appetite, pt states his kids have had a cold but no vomiting, states he was seen at [facility] this AM and sent over for "fluid". Pt awake and alert in no acute distress

## 2015-08-26 DIAGNOSIS — L255 Unspecified contact dermatitis due to plants, except food: Secondary | ICD-10-CM | POA: Diagnosis not present

## 2015-10-05 DIAGNOSIS — Z23 Encounter for immunization: Secondary | ICD-10-CM | POA: Diagnosis not present

## 2015-10-05 DIAGNOSIS — L209 Atopic dermatitis, unspecified: Secondary | ICD-10-CM | POA: Diagnosis not present

## 2016-04-12 DIAGNOSIS — J069 Acute upper respiratory infection, unspecified: Secondary | ICD-10-CM | POA: Diagnosis not present

## 2016-04-12 DIAGNOSIS — R6883 Chills (without fever): Secondary | ICD-10-CM | POA: Diagnosis not present

## 2016-08-06 DIAGNOSIS — J0100 Acute maxillary sinusitis, unspecified: Secondary | ICD-10-CM | POA: Diagnosis not present

## 2016-09-30 DIAGNOSIS — S161XXA Strain of muscle, fascia and tendon at neck level, initial encounter: Secondary | ICD-10-CM | POA: Diagnosis not present

## 2016-09-30 DIAGNOSIS — S40029A Contusion of unspecified upper arm, initial encounter: Secondary | ICD-10-CM | POA: Diagnosis not present

## 2017-01-10 DIAGNOSIS — J029 Acute pharyngitis, unspecified: Secondary | ICD-10-CM | POA: Diagnosis not present

## 2017-04-04 ENCOUNTER — Encounter: Payer: Self-pay | Admitting: Urology

## 2017-04-04 ENCOUNTER — Ambulatory Visit: Payer: BLUE CROSS/BLUE SHIELD | Admitting: Urology

## 2017-04-04 VITALS — BP 138/90 | HR 67 | Ht 71.0 in | Wt 302.8 lb

## 2017-04-04 DIAGNOSIS — Z3009 Encounter for other general counseling and advice on contraception: Secondary | ICD-10-CM

## 2017-04-04 MED ORDER — DIAZEPAM 10 MG PO TABS: ORAL_TABLET | ORAL | 0 refills | Status: AC

## 2017-04-04 NOTE — Progress Notes (Signed)
04/04/2017 1:21 PM   Luis Mcdonald 10/06/1980 [id number]  Referring provider: No referring provider defined for this encounter.  Chief Complaint  Patient presents with  . VAS Consult    HPI: Luis CavaJesse Mcdonald is a 37 year old male seen for vasectomy counseling.  He is married with 2 children and states he and his wife desire vasectomy as a means of permanent sterilization.  He denies previous history of urologic problems specifically chronic scrotal pain, history of epididymitis or prior genitourinary surgery.   PMH: Past Medical History:  Diagnosis Date  . Allergy   . GERD (gastroesophageal reflux disease)   . Ulcer     Surgical History: Past Surgical History:  Procedure Laterality Date  . UPPER GI ENDOSCOPY  2013    Home Medications:  Allergies as of 04/04/2017   No Known Allergies     Medication List    as of 04/04/2017  1:21 PM   You have not been prescribed any medications.     Allergies: No Known Allergies  Family History: Family History  Problem Relation Age of Onset  . Thyroid disease Mother   . Cancer Father        skin  . Heart disease Maternal Grandmother   . Cancer Maternal Grandfather        lymphoma  . Diabetes Paternal Grandfather     Social History:  reports that  has never smoked. he has never used smokeless tobacco. He reports that he drinks alcohol. He reports that he does not use drugs.  ROS: UROLOGY Frequent Urination?: No Hard to postpone urination?: No Burning/pain with urination?: No Get up at night to urinate?: Yes Leakage of urine?: Yes Urine stream starts and stops?: No Trouble starting stream?: No Do you have to strain to urinate?: No Blood in urine?: No Urinary tract infection?: No Sexually transmitted disease?: No Injury to kidneys or bladder?: No Painful intercourse?: No Weak stream?: No Erection problems?: No Penile pain?: No  Gastrointestinal Nausea?: No Vomiting?: No Indigestion/heartburn?: Yes Diarrhea?:  Yes Constipation?: No  Constitutional Fever: No Night sweats?: No Weight loss?: No Fatigue?: No  Skin Skin rash/lesions?: No Itching?: No  Eyes Blurred vision?: No Double vision?: No  Ears/Nose/Throat Sore throat?: No Sinus problems?: No  Hematologic/Lymphatic Swollen glands?: Yes Easy bruising?: No  Cardiovascular Leg swelling?: No Chest pain?: No  Respiratory Cough?: No Shortness of breath?: No  Endocrine Excessive thirst?: Yes  Musculoskeletal Back pain?: No Joint pain?: No  Neurological Headaches?: No Dizziness?: No  Psychologic Depression?: No Anxiety?: No  Physical Exam: BP 138/90 (BP Location: Left Arm, Patient Position: Sitting, Cuff Size: Large)   Pulse 67   Ht 5\' 11"  (1.803 m)   Wt (!) 302 lb 12.8 oz (137.3 kg)   BMI 42.23 kg/m   Constitutional:  Alert and oriented, No acute distress. HEENT:  AT, moist mucus membranes.  Trachea midline, no masses. Cardiovascular: No clubbing, cyanosis, or edema. Respiratory: Normal respiratory effort, no increased work of breathing. GI: Abdomen is soft, nontender, nondistended, no abdominal masses GU: No CVA tenderness.  Penis circumcised without lesions, testes descended bilaterally without masses or tenderness, thick spermatic cords bilaterally however the vasa are palpable. Skin: No rashes, bruises or suspicious lesions. Lymph: No cervical or inguinal adenopathy. Neurologic: Grossly intact, no focal deficits, moving all 4 extremities. Psychiatric: Normal mood and affect.  Laboratory Data: Lab Results  Component Value Date   WBC 8.4 01/28/2015   HGB 16.6 01/28/2015   HCT 47.9 01/28/2015   MCV  84.6 01/28/2015   PLT 218 01/28/2015    Lab Results  Component Value Date   CREATININE 0.92 01/28/2015    Assessment & Plan:    1. Encounter for vasectomy counseling  We had a long discussion about vasectomy. We specifically discussed the procedure, recovery and the risks, benefits and  alternatives of vasectomy. I explained that the procedure entails removal of a segment of each vas deferens, each of which conducts sperm, and that the purpose of this procedure is to cause sterility (inability to produce children or cause pregnancy). Vasectomy is intended to be permanent and irreversible form of contraception. Options for fertility after vasectomy include vasectomy reversal, or sperm retrieval with in vitro fertilization. These options are not always successful, and they may be expensive. We discussed reversible forms of birth control such as condoms, IUD or diaphragms, as well as the option of freezing sperm in a sperm bank prior to the vasectomy procedure. We discussed the importance of avoiding strenuous exercise for four days after vasectomy, and the importance of refraining from any form of ejaculation for seven days after vasectomy. I explained that vasectomy does not produce immediate sterility so another form of contraceptive must be used until sterility is assured by having semen checked for sperm. Thus, a post vasectomy semen analysis is necessary to confirm sterility. Rarely, vasectomy must be repeated. We discussed the approximately 1 in 2,000 risk of pregnancy after vasectomy for men who have post-vasectomy semen analysis showing absent sperm or rare non-motile sperm. Typical side effects include a small amount of oozing blood, some discomfort and mild swelling in the area of incision.  Vasectomy does not affect sexual performance, function, please, sensation, interest, desire, satisfaction, penile erection, volume of semen or ejaculation. Other rare risks include allergy or adverse reaction to an anesthetic, testicular atrophy, hematoma, infection/abscess, prolonged tenderness of the vas deferens, pain, swelling, painful nodule or scar (called sperm granuloma) or epididymtis. We discussed chronic testicular pain syndrome. This has been reported to occur in as many as 1-2% of men and  may be permanent. This can be treated with medication, small procedures or (rarely) surgery.  Rx Valium was sent to his pharmacy to take 30 minutes prior to the procedure.  He was informed he would need a driver.    Riki Altes, MD  [facility] [address] [place], [place] [address] [phone]

## 2017-05-13 ENCOUNTER — Encounter: Payer: Self-pay | Admitting: Urology

## 2017-05-13 ENCOUNTER — Ambulatory Visit (INDEPENDENT_AMBULATORY_CARE_PROVIDER_SITE_OTHER): Payer: BLUE CROSS/BLUE SHIELD | Admitting: Urology

## 2017-05-13 VITALS — BP 131/86 | HR 61

## 2017-05-13 DIAGNOSIS — Z302 Encounter for sterilization: Secondary | ICD-10-CM

## 2017-05-13 MED ORDER — HYDROCODONE-ACETAMINOPHEN 5-325 MG PO TABS: 1.0000 | ORAL_TABLET | ORAL | 0 refills | Status: AC | PRN

## 2017-05-13 NOTE — Progress Notes (Signed)
Vasectomy Procedure Note  Indications: The patient is a 36 y.o. male who presents today for elective sterilization.  He has been consented for the procedure.  He is aware of the risks and benefits.  He had no additional questions.  He agrees to proceed.  He denies any other significant change since his last visit.  Pre-operative Diagnosis: Elective sterilization  Post-operative Diagnosis: Elective sterilization  Premedication: Valium 10 mg po  Surgeon: Scott C. Stoioff, M.D  Description: The patient was prepped and draped in the standard fashion.  The right vas deferens was identified and brought superiorly to the anterior scrotal skin.  The skin and vas was then anesthetized utilizing 6 ml 1% lidocaine.  A small stab incision was made and spread with the vas dissector.  The vas was grasped utilizing the vas clamp and elevated out of the incision.  The vas was dissected free from surrounding tissue and vessels and an ~1 cm segment was excised.  The vas lumens were cauterized utilizing electrocautery.  The distal segment was buried in the surrounding sheath with a 3-0 chromic suture.  No significant bleeding was observed.  The vas ends were then dropped back into the hemiscrotum.  The skin was closed with hemostatic pressure.  An identical procedure was performed on the contralateral side.  Clean dry gauze was applied to the incision sites.  The patient tolerated the procedure well.  Complications:None  Recommendations: 1.  No lifting greater than 10 pounds or strenuousactivity for 1 week. 2.  Scrotal support for 1 week. 3.  Shower only for 1 week; may shower in the morning 4.  May resume intercourse in one week if no significant discomfort.  Continue alternate contraception for 12 weeks.  5.  Call for significant pain, swelling, redness, drainage or fever greater than 100.5. 6.  Rx hydrocodone/APAP 5/325 1-2 every 6 hours as needed for pain. 7.  Follow-up semen analyses 6 and 12 weeks.  

## 2017-06-23 DIAGNOSIS — J45909 Unspecified asthma, uncomplicated: Secondary | ICD-10-CM | POA: Diagnosis not present

## 2017-06-23 DIAGNOSIS — R062 Wheezing: Secondary | ICD-10-CM | POA: Diagnosis not present

## 2017-06-23 DIAGNOSIS — J0140 Acute pansinusitis, unspecified: Secondary | ICD-10-CM | POA: Diagnosis not present

## 2017-06-27 DIAGNOSIS — J40 Bronchitis, not specified as acute or chronic: Secondary | ICD-10-CM | POA: Diagnosis not present

## 2017-08-08 ENCOUNTER — Other Ambulatory Visit: Payer: Self-pay | Admitting: Family Medicine

## 2017-08-08 DIAGNOSIS — Z9852 Vasectomy status: Secondary | ICD-10-CM

## 2017-08-12 ENCOUNTER — Other Ambulatory Visit: Payer: BLUE CROSS/BLUE SHIELD

## 2017-08-12 DIAGNOSIS — Z9852 Vasectomy status: Secondary | ICD-10-CM | POA: Diagnosis not present

## 2017-08-13 LAB — POST-VAS SPERM EVALUATION,QUAL: Volume: 1.2 mL

## 2017-08-19 ENCOUNTER — Telehealth: Payer: Self-pay

## 2017-08-19 NOTE — Telephone Encounter (Signed)
lmom for pt to call office

## 2017-08-19 NOTE — Telephone Encounter (Signed)
-----   Message from Riki AltesScott C Stoioff, MD sent at 08/18/2017  2:11 PM EDT ----- Semen sample shows no sperm present.  Clear to use vasectomy as primary contraception.

## 2017-08-19 NOTE — Telephone Encounter (Signed)
Pt informed

## 2018-02-28 DIAGNOSIS — F4322 Adjustment disorder with anxiety: Secondary | ICD-10-CM | POA: Diagnosis not present

## 2018-03-07 DIAGNOSIS — F4322 Adjustment disorder with anxiety: Secondary | ICD-10-CM | POA: Diagnosis not present

## 2018-03-13 DIAGNOSIS — F4322 Adjustment disorder with anxiety: Secondary | ICD-10-CM | POA: Diagnosis not present

## 2018-03-23 DIAGNOSIS — F4322 Adjustment disorder with anxiety: Secondary | ICD-10-CM | POA: Diagnosis not present

## 2018-03-29 DIAGNOSIS — F4322 Adjustment disorder with anxiety: Secondary | ICD-10-CM | POA: Diagnosis not present

## 2018-04-05 DIAGNOSIS — F4322 Adjustment disorder with anxiety: Secondary | ICD-10-CM | POA: Diagnosis not present

## 2018-04-10 DIAGNOSIS — F4322 Adjustment disorder with anxiety: Secondary | ICD-10-CM | POA: Diagnosis not present

## 2018-04-17 DIAGNOSIS — F4322 Adjustment disorder with anxiety: Secondary | ICD-10-CM | POA: Diagnosis not present

## 2018-04-25 DIAGNOSIS — F4322 Adjustment disorder with anxiety: Secondary | ICD-10-CM | POA: Diagnosis not present

## 2018-05-23 DIAGNOSIS — F4322 Adjustment disorder with anxiety: Secondary | ICD-10-CM | POA: Diagnosis not present

## 2018-08-28 DIAGNOSIS — Z0184 Encounter for antibody response examination: Secondary | ICD-10-CM | POA: Diagnosis not present

## 2018-08-28 DIAGNOSIS — Z20828 Contact with and (suspected) exposure to other viral communicable diseases: Secondary | ICD-10-CM | POA: Diagnosis not present

## 2020-09-06 ENCOUNTER — Ambulatory Visit (HOSPITAL_COMMUNITY): Payer: 59

## 2020-09-06 ENCOUNTER — Ambulatory Visit: Payer: 59 | Attending: Internal Medicine

## 2020-09-06 DIAGNOSIS — Z20822 Contact with and (suspected) exposure to covid-19: Secondary | ICD-10-CM | POA: Insufficient documentation

## 2020-09-06 LAB — COVID-19 CORONAVIRUS QUALITATIVE PCR: COVID-19 Coronavirus Qual PCR Result: NOT DETECTED

## 2020-09-06 NOTE — Progress Notes (Signed)
Patient was seen today at the [facility] COVID-19 testing site where a dual collection sample was taken from the anterior nares. The specimen was sent to the [facility] lab for COVID-19 testing. Results will be available within 48 hours. Patient received informational instructions on self-care. The specimen was collected by designated RN/LPN/MA per standing order.

## 2020-09-08 NOTE — Result Encounter Note (Signed)
[facility] message sent for negative COVID-19 test result
